# Patient Record
Sex: Male | Born: 1990 | Race: White | Hispanic: No | Marital: Single | Smoking: Never smoker
Health system: Southern US, Community
[De-identification: ages and names within clinical notes are randomized; demographics above are authoritative.]

## PROBLEM LIST (undated history)

## (undated) DIAGNOSIS — S42401A Unspecified fracture of lower end of right humerus, initial encounter for closed fracture: Secondary | ICD-10-CM

---

## 2019-12-18 ENCOUNTER — Encounter (HOSPITAL_COMMUNITY): Payer: Self-pay | Admitting: Family Medicine

## 2019-12-18 ENCOUNTER — Other Ambulatory Visit: Payer: Self-pay

## 2019-12-18 ENCOUNTER — Ambulatory Visit (HOSPITAL_COMMUNITY)
Admission: EM | Admit: 2019-12-18 | Discharge: 2019-12-18 | Disposition: A | Discharge status: Home / Self Care | Payer: BC Managed Care – PPO | Attending: Family Medicine | Admitting: Family Medicine

## 2019-12-18 ENCOUNTER — Ambulatory Visit (INDEPENDENT_AMBULATORY_CARE_PROVIDER_SITE_OTHER): Payer: BC Managed Care – PPO

## 2019-12-18 DIAGNOSIS — Y9321 Activity, ice skating: Secondary | ICD-10-CM

## 2019-12-18 DIAGNOSIS — S42432A Displaced fracture (avulsion) of lateral epicondyle of left humerus, initial encounter for closed fracture: Secondary | ICD-10-CM

## 2019-12-18 HISTORY — DX: Unspecified fracture of lower end of right humerus, initial encounter for closed fracture: S42.401A

## 2019-12-18 MED ORDER — HYDROCODONE-ACETAMINOPHEN 5-325 MG PO TABS: 1.0000 | ORAL_TABLET | Freq: Four times a day (QID) | ORAL | 0 refills | Status: AC | PRN

## 2019-12-18 NOTE — ED Notes (Signed)
Ortho called to place splint, will be in department directly

## 2019-12-18 NOTE — ED Notes (Signed)
Provided ice pack for left arm injury

## 2019-12-18 NOTE — ED Provider Notes (Signed)
[facility]    CSN: [id number] Arrival date & time: 12/18/19  1342      History   Chief Complaint No chief complaint on file.   HPI Brett Farley is a 28 y.o. male.   28 yo male making initial visit to [facility] complaining of left elbow injury.  Patient was [place] the skating rink today and lost his balance and fell directly on the elbow.  He has a lot of swelling there now and is unable to bend or flex the elbow about serious pain.  He also has trouble pronating and supinating the elbow.  He denies any other injury other than abrasion on the left middle finger, volar side.  He says it wrist and finger movement is nonpainful.     History reviewed. No pertinent past medical history.  There are no problems to display for this patient.   History reviewed. No pertinent surgical history.     Home Medications    Prior to Admission medications   Medication Sig Start Date End Date Taking? Authorizing Provider  HYDROcodone-acetaminophen (NORCO) 5-325 MG tablet Take 1 tablet by mouth every 6 (six) hours as needed for moderate pain. 12/18/19   Elvina Sidle, MD    Family History History reviewed. No pertinent family history.  Social History Social History   Tobacco Use  . Smoking status: Not on file  Substance Use Topics  . Alcohol use: Not on file  . Drug use: Not on file     Allergies   Patient has no allergy information on record.   Review of Systems Review of Systems  All other systems reviewed and are negative.    Physical Exam Triage Vital Signs ED Triage Vitals  Enc Vitals Group     BP      Pulse      Resp      Temp      Temp src      SpO2      Weight      Height      Head Circumference      Peak Flow      Pain Score      Pain Loc      Pain Edu?      Excl. in GC?    No data found.  Updated Vital Signs BP (!) 145/81 (BP Location: Right Arm) Comment: RN notified. SRP  Pulse 77   Temp 98.2 F (36.8 C) (Oral)   Resp 18    SpO2 96%    Physical Exam Vitals and nursing note reviewed.  Constitutional:      Appearance: Normal appearance. He is normal weight. He is not ill-appearing.  HENT:     Head: Normocephalic.  Eyes:     Conjunctiva/sclera: Conjunctivae normal.  Cardiovascular:     Rate and Rhythm: Normal rate.  Pulmonary:     Effort: Pulmonary effort is normal.  Musculoskeletal:        General: Swelling, tenderness and signs of injury present.     Cervical back: Normal range of motion and neck supple.     Comments: Marked swelling over the left radial head with inability to move the elbow.  Skin:    General: Skin is warm and dry.  Neurological:     General: No focal deficit present.     Mental Status: He is alert.  Psychiatric:        Mood and Affect: Mood normal.      UC Treatments / Results  Labs (all labs ordered are listed, but only abnormal results are displayed) Labs Reviewed - No data to display  EKG   Radiology Plain films of the left elbow show what appear to be fragments from the lateral epicondyle and ulna.  Procedures Procedures (including critical care time)  Medications Ordered in UC Medications - No data to display  Initial Impression / Assessment and Plan / UC Course  I have reviewed the triage vital signs and the nursing notes.  Pertinent labs & imaging results that were available during my care of the patient were reviewed by me and considered in my medical decision making (see chart for details).  Clinical Course as of Dec 17 1514  Sat Dec 18, 2019  1506 DG Elbow Complete Left [KL]    Clinical Course User Index [KL] Robyn Haber, MD   Final Clinical Impressions(s) / UC Diagnoses   Final diagnoses:  Displaced fracture (avulsion) of lateral epicondyle of left humerus, initial encounter for closed fracture   Discharge Instructions   None    ED Prescriptions    Medication Sig Dispense Auth. Provider   HYDROcodone-acetaminophen (NORCO) 5-325 MG  tablet Take 1 tablet by mouth every 6 (six) hours as needed for moderate pain. 20 tablet Robyn Haber, MD     I have reviewed the PDMP during this encounter.   Robyn Haber, MD 12/18/19 1515

## 2019-12-18 NOTE — ED Notes (Signed)
Gwendel Hanson, CMA reports she gave paperwork to patient

## 2019-12-18 NOTE — Progress Notes (Signed)
Orthopedic Tech Progress Note Patient Details:  Brett Farley 1991-01-16 [id number] After having a conversation with the MD, I asked him could I apply a LONG ARM SPLINT instead, because a SUGARTONG splint would apply pressure to the hurt ELBOW . So he said that would be alright. I added padding to ELBOW before I applied a LONG ARM SPLINT. Ortho Devices Type of Ortho Device: Long arm splint, Ankle splint Ortho Device/Splint Location: LUE Ortho Device/Splint Interventions: Application, Ordered   Post Interventions Patient Tolerated: Well Instructions Provided: Care of device, Adjustment of device   Janit Pagan 12/18/2019, 3:53 PM

## 2019-12-18 NOTE — ED Triage Notes (Signed)
Fell while [employer] today.  Injury to left forearm.  Radial pulse in left arm is 2 +, brisk cap refill and able to move fingers

## 2019-12-18 NOTE — ED Notes (Signed)
Ortho tech completed task

## 2019-12-22 ENCOUNTER — Other Ambulatory Visit: Payer: Self-pay

## 2019-12-22 ENCOUNTER — Ambulatory Visit
Admission: RE | Admit: 2019-12-22 | Discharge: 2019-12-22 | Disposition: A | Discharge status: Home / Self Care | Payer: BC Managed Care – PPO | Source: Ambulatory Visit | Attending: Orthopedic Surgery | Admitting: Orthopedic Surgery

## 2019-12-22 ENCOUNTER — Other Ambulatory Visit: Payer: Self-pay | Admitting: Orthopedic Surgery

## 2019-12-22 DIAGNOSIS — M25522 Pain in left elbow: Secondary | ICD-10-CM

## 2019-12-29 ENCOUNTER — Other Ambulatory Visit: Payer: BC Managed Care – PPO

## 2020-11-28 IMAGING — CT CT ELBOW*L* W/O CM
1 series · 12 of 14 positions shown, 15 images · non-contrast
Comparison: Radiographs 12/18/2019.

CLINICAL DATA: Skateboarding injury 12/18/2019. Lateral humeral
condyle fracture.

EXAM:
CT OF THE UPPER LEFT EXTREMITY WITHOUT CONTRAST
TECHNIQUE: Multidetector CT imaging of the left elbow was performed according
to the standard protocol.

[Series 3: ext soft · axial · 0.34mm/px · z∈[+35,+163]mm · 12 of 76 slices shown, 15 images]
[im 6/76  soft-tissue]
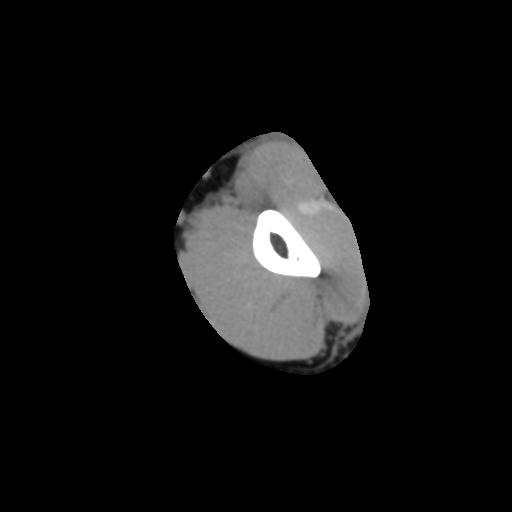
[im 6/76  bone]
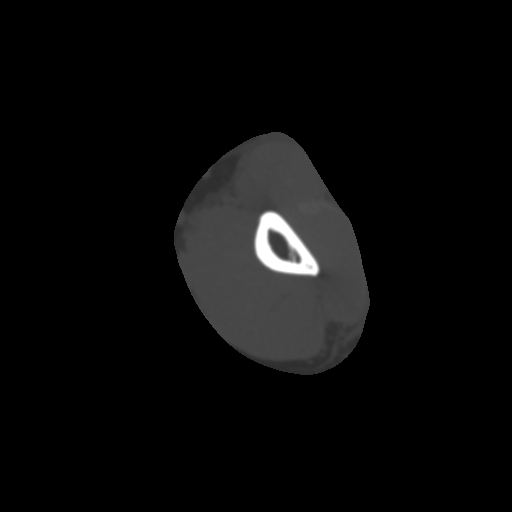
[im 12/76  bone]
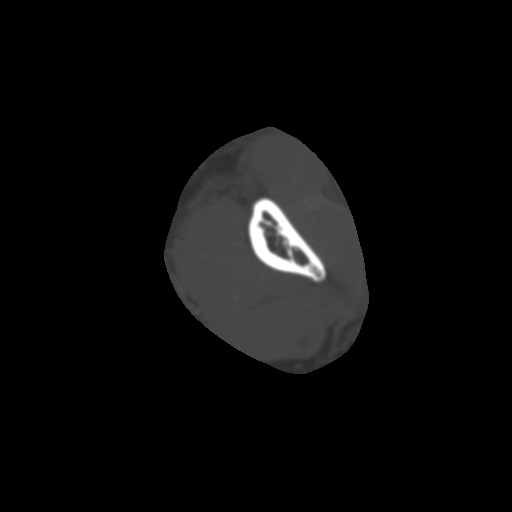
[im 18/76  bone]
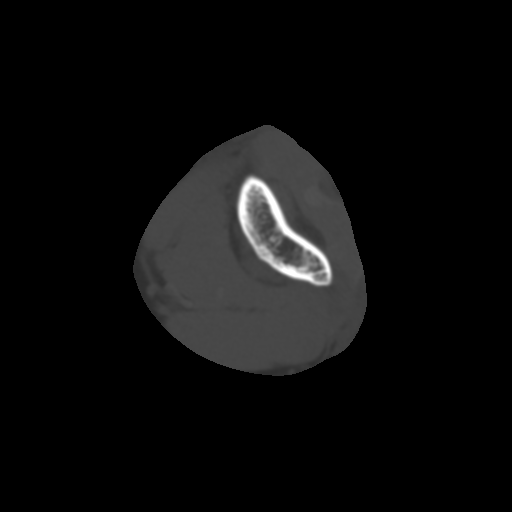
[im 24/76  bone]
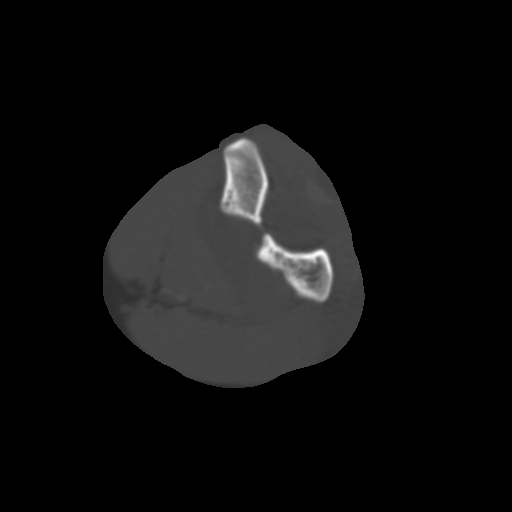
[im 29/76  soft-tissue]
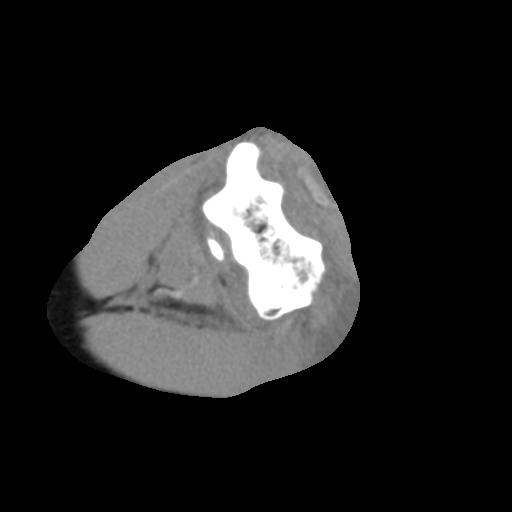
[im 29/76  bone]
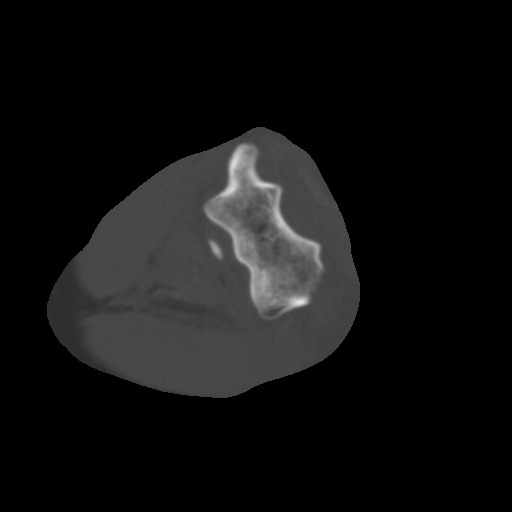
[im 35/76  bone]
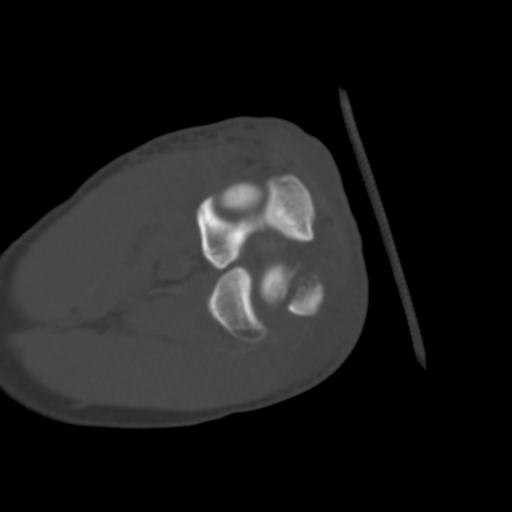
[im 41/76  bone]
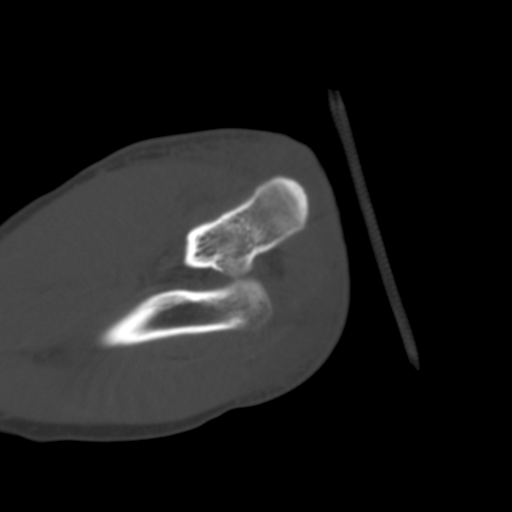
[im 47/76  bone]
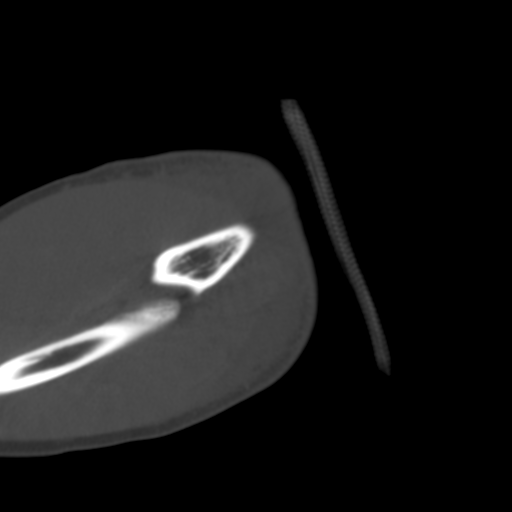
[im 52/76  soft-tissue]
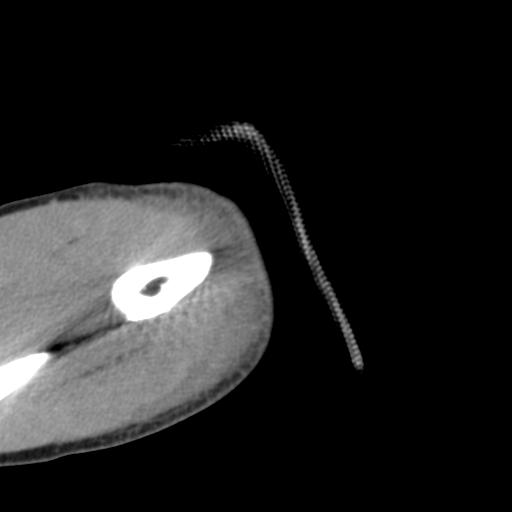
[im 52/76  bone]
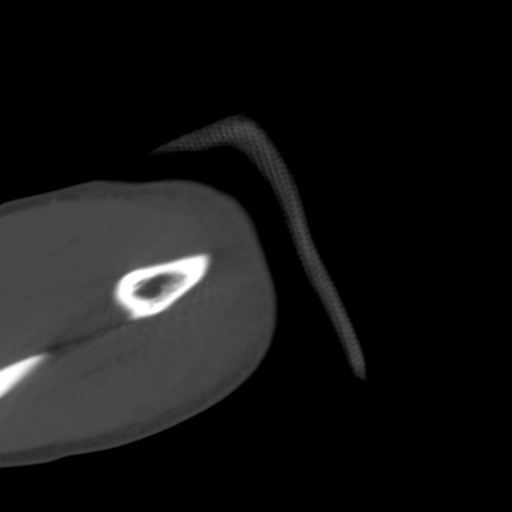
[im 58/76  bone]
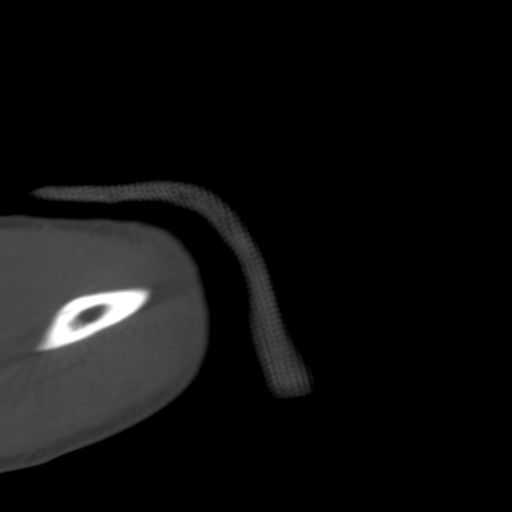
[im 64/76  bone]
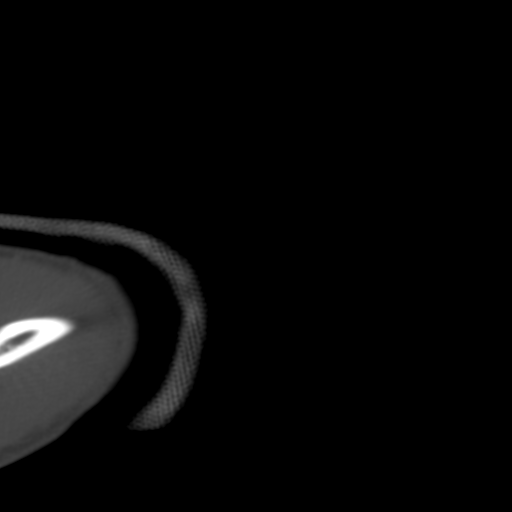
[im 70/76  bone]
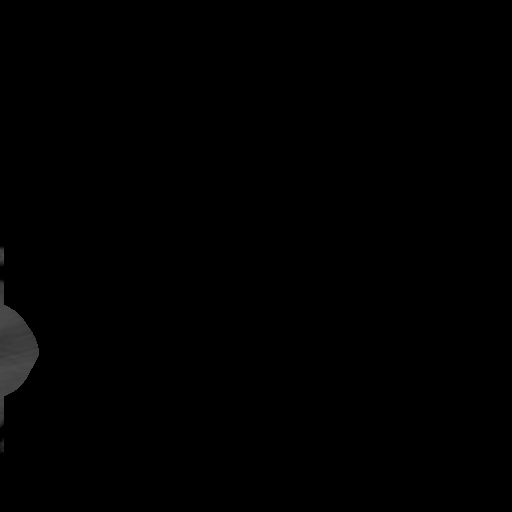

[12 of 14 positions shown; findings below may reference images not displayed]

FINDINGS: Bones/Joint/Cartilage

The elbow is splinted. As demonstrated on the recent radiographs,
there is a moderately displaced fracture through the lateral humeral
condyle. This fracture is moderately comminuted and demonstrates up
to 11 mm of displacement. The largest fragment measures 19 mm in
length on coronal image 53/9. The fracture extends distally to
involve the posterior aspect of capitellum.

There is a nondisplaced fracture of the coronoid process. The radius
is intact and is normally located.

There is a large hemarthrosis. Small intra-articular fracture
fragments are present.

Ligaments

Suboptimally assessed by CT.

Muscles and Tendons

Portions of the common extensor tendon insert on the avulsed
fracture fragment. The biceps and triceps tendons are intact.

Soft tissues

Moderate dorsal and lateral soft tissue swelling at the elbow
without focal hematoma.
IMPRESSION: 1. Moderately comminuted and displaced fracture of the lateral
humeral condyle as described. This fracture involves the dorsal
articular surface of the capitellum, and portions of common extensor
tendon inserts on the dominant avulsed fracture fragment.
2. Nondisplaced fracture of the coronoid process.
3. Large hemarthrosis with small intra-articular fracture fragments.
